# Patient Record
Sex: Female | Born: 1950 | Race: Black or African American | Hispanic: No | Marital: Married | Smoking: Former smoker
Health system: Southern US, Community
[De-identification: ages and names within clinical notes are randomized; demographics above are authoritative.]

## PROBLEM LIST (undated history)

## (undated) DIAGNOSIS — I10 Essential (primary) hypertension: Secondary | ICD-10-CM

## (undated) HISTORY — PX: BREAST EXCISIONAL BIOPSY: SUR124

## (undated) HISTORY — PX: COLONOSCOPY: SHX174

---

## 2005-03-09 ENCOUNTER — Ambulatory Visit: Payer: Self-pay

## 2006-07-12 ENCOUNTER — Ambulatory Visit: Payer: Self-pay

## 2009-10-27 ENCOUNTER — Ambulatory Visit: Payer: Self-pay | Admitting: Family Medicine

## 2011-08-17 ENCOUNTER — Ambulatory Visit: Payer: Self-pay | Admitting: Family Medicine

## 2012-08-28 ENCOUNTER — Ambulatory Visit: Payer: Self-pay | Admitting: Family Medicine

## 2013-11-25 ENCOUNTER — Ambulatory Visit: Payer: Self-pay | Admitting: Family Medicine

## 2014-11-07 ENCOUNTER — Other Ambulatory Visit: Payer: Self-pay | Admitting: Family Medicine

## 2014-11-07 DIAGNOSIS — Z1231 Encounter for screening mammogram for malignant neoplasm of breast: Secondary | ICD-10-CM

## 2014-11-27 ENCOUNTER — Ambulatory Visit
Admission: RE | Admit: 2014-11-27 | Discharge: 2014-11-27 | Disposition: A | Discharge status: Home / Self Care | Payer: No Typology Code available for payment source | Source: Ambulatory Visit | Attending: Family Medicine | Admitting: Family Medicine

## 2014-11-27 DIAGNOSIS — Z1231 Encounter for screening mammogram for malignant neoplasm of breast: Secondary | ICD-10-CM | POA: Insufficient documentation

## 2015-10-19 ENCOUNTER — Other Ambulatory Visit: Payer: Self-pay | Admitting: Family Medicine

## 2015-10-19 DIAGNOSIS — Z1231 Encounter for screening mammogram for malignant neoplasm of breast: Secondary | ICD-10-CM

## 2015-11-30 ENCOUNTER — Other Ambulatory Visit: Payer: Self-pay | Admitting: Family Medicine

## 2015-11-30 ENCOUNTER — Ambulatory Visit
Admission: RE | Admit: 2015-11-30 | Discharge: 2015-11-30 | Disposition: A | Discharge status: Home / Self Care | Payer: Medicare HMO | Source: Ambulatory Visit | Attending: Family Medicine | Admitting: Family Medicine

## 2015-11-30 DIAGNOSIS — Z1231 Encounter for screening mammogram for malignant neoplasm of breast: Secondary | ICD-10-CM

## 2016-05-25 ENCOUNTER — Other Ambulatory Visit: Payer: Self-pay | Admitting: Family Medicine

## 2016-05-25 DIAGNOSIS — Z13820 Encounter for screening for osteoporosis: Secondary | ICD-10-CM

## 2016-07-13 ENCOUNTER — Ambulatory Visit: Payer: No Typology Code available for payment source | Attending: Family Medicine

## 2016-08-29 ENCOUNTER — Ambulatory Visit
Admission: RE | Admit: 2016-08-29 | Discharge: 2016-08-29 | Disposition: A | Discharge status: Home / Self Care | Payer: Medicare HMO | Source: Ambulatory Visit | Attending: Family Medicine | Admitting: Family Medicine

## 2016-08-29 DIAGNOSIS — Z13820 Encounter for screening for osteoporosis: Secondary | ICD-10-CM | POA: Insufficient documentation

## 2016-08-29 DIAGNOSIS — M8580 Other specified disorders of bone density and structure, unspecified site: Secondary | ICD-10-CM | POA: Insufficient documentation

## 2016-10-20 ENCOUNTER — Other Ambulatory Visit: Payer: Self-pay | Admitting: Family Medicine

## 2016-10-20 DIAGNOSIS — Z1231 Encounter for screening mammogram for malignant neoplasm of breast: Secondary | ICD-10-CM

## 2016-11-30 ENCOUNTER — Ambulatory Visit
Admission: RE | Admit: 2016-11-30 | Discharge: 2016-11-30 | Disposition: A | Discharge status: Home / Self Care | Payer: Medicare HMO | Source: Ambulatory Visit | Attending: Family Medicine | Admitting: Family Medicine

## 2016-11-30 DIAGNOSIS — Z1231 Encounter for screening mammogram for malignant neoplasm of breast: Secondary | ICD-10-CM

## 2017-10-19 ENCOUNTER — Other Ambulatory Visit: Payer: Self-pay | Admitting: Family Medicine

## 2017-10-19 DIAGNOSIS — Z1231 Encounter for screening mammogram for malignant neoplasm of breast: Secondary | ICD-10-CM

## 2017-12-20 ENCOUNTER — Ambulatory Visit
Admission: RE | Admit: 2017-12-20 | Discharge: 2017-12-20 | Disposition: A | Discharge status: Home / Self Care | Payer: Medicare HMO | Source: Ambulatory Visit | Attending: Family Medicine | Admitting: Family Medicine

## 2017-12-20 DIAGNOSIS — Z1231 Encounter for screening mammogram for malignant neoplasm of breast: Secondary | ICD-10-CM | POA: Insufficient documentation

## 2017-12-25 ENCOUNTER — Other Ambulatory Visit: Payer: Self-pay | Admitting: Family Medicine

## 2017-12-25 DIAGNOSIS — R928 Other abnormal and inconclusive findings on diagnostic imaging of breast: Secondary | ICD-10-CM

## 2017-12-25 DIAGNOSIS — R921 Mammographic calcification found on diagnostic imaging of breast: Secondary | ICD-10-CM

## 2017-12-28 ENCOUNTER — Ambulatory Visit
Admission: RE | Admit: 2017-12-28 | Discharge: 2017-12-28 | Disposition: A | Discharge status: Home / Self Care | Payer: Medicare HMO | Source: Ambulatory Visit | Attending: Family Medicine | Admitting: Family Medicine

## 2017-12-28 DIAGNOSIS — R921 Mammographic calcification found on diagnostic imaging of breast: Secondary | ICD-10-CM

## 2017-12-28 DIAGNOSIS — R928 Other abnormal and inconclusive findings on diagnostic imaging of breast: Secondary | ICD-10-CM

## 2018-06-15 ENCOUNTER — Encounter: Payer: Self-pay | Admitting: Family Medicine

## 2018-06-18 ENCOUNTER — Other Ambulatory Visit: Payer: Self-pay

## 2018-06-18 DIAGNOSIS — Z1211 Encounter for screening for malignant neoplasm of colon: Secondary | ICD-10-CM

## 2018-06-21 ENCOUNTER — Other Ambulatory Visit: Payer: Self-pay

## 2018-06-21 ENCOUNTER — Encounter: Payer: Self-pay | Admitting: *Deleted

## 2018-06-27 NOTE — Discharge Instructions (Signed)
General Anesthesia, Adult, Care After  This sheet gives you information about how to care for yourself after your procedure. Your health care provider may also give you more specific instructions. If you have problems or questions, contact your health care provider.  What can I expect after the procedure?  After the procedure, the following side effects are common:  Pain or discomfort at the IV site.  Nausea.  Vomiting.  Sore throat.  Trouble concentrating.  Feeling cold or chills.  Weak or tired.  Sleepiness and fatigue.  Soreness and body aches. These side effects can affect parts of the body that were not involved in surgery.  Follow these instructions at home:    For at least 24 hours after the procedure:  Have a responsible adult stay with you. It is important to have someone help care for you until you are awake and alert.  Rest as needed.  Do not:  Participate in activities in which you could fall or become injured.  Drive.  Use heavy machinery.  Drink alcohol.  Take sleeping pills or medicines that cause drowsiness.  Make important decisions or sign legal documents.  Take care of children on your own.  Eating and drinking  Follow any instructions from your health care provider about eating or drinking restrictions.  When you feel hungry, start by eating small amounts of foods that are soft and easy to digest (bland), such as toast. Gradually return to your regular diet.  Drink enough fluid to keep your urine pale yellow.  If you vomit, rehydrate by drinking water, juice, or clear broth.  General instructions  If you have sleep apnea, surgery and certain medicines can increase your risk for breathing problems. Follow instructions from your health care provider about wearing your sleep device:  Anytime you are sleeping, including during daytime naps.  While taking prescription pain medicines, sleeping medicines, or medicines that make you drowsy.  Return to your normal activities as told by your health care  provider. Ask your health care provider what activities are safe for you.  Take over-the-counter and prescription medicines only as told by your health care provider.  If you smoke, do not smoke without supervision.  Keep all follow-up visits as told by your health care provider. This is important.  Contact a health care provider if:  You have nausea or vomiting that does not get better with medicine.  You cannot eat or drink without vomiting.  You have pain that does not get better with medicine.  You are unable to pass urine.  You develop a skin rash.  You have a fever.  You have redness around your IV site that gets worse.  Get help right away if:  You have difficulty breathing.  You have chest pain.  You have blood in your urine or stool, or you vomit blood.  Summary  After the procedure, it is common to have a sore throat or nausea. It is also common to feel tired.  Have a responsible adult stay with you for the first 24 hours after general anesthesia. It is important to have someone help care for you until you are awake and alert.  When you feel hungry, start by eating small amounts of foods that are soft and easy to digest (bland), such as toast. Gradually return to your regular diet.  Drink enough fluid to keep your urine pale yellow.  Return to your normal activities as told by your health care provider. Ask your health care   provider what activities are safe for you.  This information is not intended to replace advice given to you by your health care provider. Make sure you discuss any questions you have with your health care provider.  Document Released: 10/03/2000 Document Revised: 02/10/2017 Document Reviewed: 02/10/2017  Elsevier Interactive Patient Education  2019 [employer]

## 2018-06-29 ENCOUNTER — Ambulatory Visit
Admission: RE | Admit: 2018-06-29 | Discharge: 2018-06-29 | Disposition: A | Discharge status: Home / Self Care | Payer: Medicare HMO | Attending: Gastroenterology | Admitting: Gastroenterology

## 2018-06-29 ENCOUNTER — Encounter
Admission: RE | Disposition: A | Discharge status: Home / Self Care | Payer: Self-pay | Source: Home / Self Care | Attending: Gastroenterology

## 2018-06-29 ENCOUNTER — Ambulatory Visit: Payer: Medicare HMO | Admitting: Anesthesiology

## 2018-06-29 DIAGNOSIS — I10 Essential (primary) hypertension: Secondary | ICD-10-CM | POA: Insufficient documentation

## 2018-06-29 DIAGNOSIS — D123 Benign neoplasm of transverse colon: Secondary | ICD-10-CM | POA: Diagnosis not present

## 2018-06-29 DIAGNOSIS — Z1211 Encounter for screening for malignant neoplasm of colon: Secondary | ICD-10-CM | POA: Diagnosis not present

## 2018-06-29 DIAGNOSIS — Z79899 Other long term (current) drug therapy: Secondary | ICD-10-CM | POA: Insufficient documentation

## 2018-06-29 DIAGNOSIS — D122 Benign neoplasm of ascending colon: Secondary | ICD-10-CM

## 2018-06-29 DIAGNOSIS — K641 Second degree hemorrhoids: Secondary | ICD-10-CM | POA: Diagnosis not present

## 2018-06-29 DIAGNOSIS — Z87891 Personal history of nicotine dependence: Secondary | ICD-10-CM | POA: Insufficient documentation

## 2018-06-29 HISTORY — PX: POLYPECTOMY: SHX5525

## 2018-06-29 HISTORY — PX: COLONOSCOPY WITH PROPOFOL: SHX5780

## 2018-06-29 HISTORY — DX: Essential (primary) hypertension: I10

## 2018-06-29 SURGERY — COLONOSCOPY WITH PROPOFOL
Anesthesia: General | Site: Rectum

## 2018-06-29 MED ORDER — STERILE WATER FOR IRRIGATION IR SOLN
Status: DC | PRN
  Administered 2018-06-29: 10:00:00

## 2018-06-29 MED ORDER — LACTATED RINGERS IV SOLN
INTRAVENOUS | Status: DC
  Administered 2018-06-29: 10:00:00 via INTRAVENOUS

## 2018-06-29 MED ORDER — PROPOFOL 10 MG/ML IV BOLUS
INTRAVENOUS | Status: DC | PRN
  Administered 2018-06-29: 150 mg via INTRAVENOUS
  Administered 2018-06-29 (×5): 20 mg via INTRAVENOUS

## 2018-06-29 MED ORDER — SODIUM CHLORIDE 0.9 % IV SOLN: INTRAVENOUS | Status: DC

## 2018-06-29 SURGICAL SUPPLY — 8 items
CANISTER SUCT 1200ML W/VALVE (MISCELLANEOUS) ×4 IMPLANT
FORCEPS BIOP RAD 4 LRG CAP 4 (CUTTING FORCEPS) ×4 IMPLANT
GOWN CVR UNV OPN BCK APRN NK (MISCELLANEOUS) ×4 IMPLANT
GOWN ISOL THUMB LOOP REG UNIV (MISCELLANEOUS) ×4
KIT ENDO PROCEDURE OLY (KITS) ×4 IMPLANT
SNARE SHORT THROW 13M SML OVAL (MISCELLANEOUS) ×4 IMPLANT
TRAP ETRAP POLY (MISCELLANEOUS) ×4 IMPLANT
WATER STERILE IRR 250ML POUR (IV SOLUTION) ×4 IMPLANT

## 2018-06-29 NOTE — Anesthesia Procedure Notes (Signed)
Date/Time: 06/29/2018 9:48 AM Performed by: Cameron Ali, CRNA Pre-anesthesia Checklist: Patient identified, Emergency Drugs available, Suction available, Timeout performed and Patient being monitored Patient Re-evaluated:Patient Re-evaluated prior to induction Oxygen Delivery Method: Nasal cannula Placement Confirmation: positive ETCO2

## 2018-06-29 NOTE — Op Note (Signed)
[facility] Gastroenterology Patient Name: Barbara Sanchez Procedure Date: 06/29/2018 9:44 AM MRN: [id number] Account #: [id number] Date of Birth: April 28, 1951 Admit Type: Outpatient Age: 67 Room: [facility] OR ROOM 01 Gender: Female Note Status: Finalized Procedure:            Colonoscopy Indications:          Screening for colorectal malignant neoplasm Providers:            Lucilla Lame MD, MD Referring MD:         Marguerita Merles, MD (Referring MD) Medicines:            Propofol per Anesthesia Complications:        No immediate complications. Procedure:            Pre-Anesthesia Assessment:                       - Prior to the procedure, a History and Physical was                        performed, and patient medications and allergies were                        reviewed. The patient's tolerance of previous                        anesthesia was also reviewed. The risks and benefits of                        the procedure and the sedation options and risks were                        discussed with the patient. All questions were                        answered, and informed consent was obtained. Prior                        Anticoagulants: The patient has taken no previous                        anticoagulant or antiplatelet agents. ASA Grade                        Assessment: II - A patient with mild systemic disease.                        After reviewing the risks and benefits, the patient was                        deemed in satisfactory condition to undergo the                        procedure.                       After obtaining informed consent, the colonoscope was                        passed under direct vision. Throughout the procedure,  the patient's blood pressure, pulse, and oxygen                        saturations were monitored continuously. The was                        introduced through the anus and advanced to the the                cecum, identified by appendiceal orifice and ileocecal                        valve. The colonoscopy was performed without                        difficulty. The patient tolerated the procedure well.                        The quality of the bowel preparation was excellent. Findings:      The perianal and digital rectal examinations were normal.      A 8 mm polyp was found in the transverse colon. The polyp was sessile.       The polyp was removed with a cold snare. Resection and retrieval were       complete.      A 2 mm polyp was found in the ascending colon. The polyp was sessile.       The polyp was removed with a cold biopsy forceps. Resection and       retrieval were complete.      Non-bleeding internal hemorrhoids were found during retroflexion. The       hemorrhoids were Grade II (internal hemorrhoids that prolapse but reduce       spontaneously). Impression:           - One 8 mm polyp in the transverse colon, removed with                        a cold snare. Resected and retrieved.                       - One 2 mm polyp in the ascending colon, removed with a                        cold biopsy forceps. Resected and retrieved.                       - Non-bleeding internal hemorrhoids. Recommendation:       - Discharge patient to home.                       - Resume previous diet.                       - Continue present medications.                       - Await pathology results.                       - Repeat colonoscopy in 5 years if polyp adenoma and 10  years if hyperplastic Procedure Code(s):    --- Professional ---                       [phone], Colonoscopy, flexible; with removal of tumor(s),                        polyp(s), or other lesion(s) by snare technique                       [id number], 59, Colonoscopy, flexible; with biopsy, single                        or multiple Diagnosis Code(s):    --- Professional ---                        Z12.11, Encounter for screening for malignant neoplasm                        of colon                       D12.3, Benign neoplasm of transverse colon (hepatic                        flexure or splenic flexure)                       D12.2, Benign neoplasm of ascending colon CPT copyright 2018 American Medical Association. All rights reserved. The codes documented in this report are preliminary and upon coder review may  be revised to meet current compliance requirements. Lucilla Lame MD, MD 06/29/2018 10:09:56 AM This report has been signed electronically. Number of Addenda: 0 Note Initiated On: 06/29/2018 9:44 AM Scope Withdrawal Time: 0 hours 7 minutes 24 seconds  Total Procedure Duration: 0 hours 15 minutes 37 seconds       [facility]

## 2018-06-29 NOTE — Transfer of Care (Signed)
Immediate Anesthesia Transfer of Care Note  Patient: Barbara Sanchez  Procedure(s) Performed: COLONOSCOPY WITH PROPOFOL (N/A Rectum) POLYPECTOMY (Rectum)  Patient Location: PACU  Anesthesia Type: General  Level of Consciousness: awake, alert  and patient cooperative  Airway and Oxygen Therapy: Patient Spontanous Breathing and Patient connected to supplemental oxygen  Post-op Assessment: Post-op Vital signs reviewed, Patient's Cardiovascular Status Stable, Respiratory Function Stable, Patent Airway and No signs of Nausea or vomiting  Post-op Vital Signs: Reviewed and stable  Complications: No apparent anesthesia complications

## 2018-06-29 NOTE — Anesthesia Preprocedure Evaluation (Signed)
Anesthesia Evaluation  Patient identified by MRN, date of birth, ID band Patient awake    Reviewed: Allergy & Precautions, NPO status , Patient's Chart, lab work & pertinent test results  Airway Mallampati: II  TM Distance: >3 FB Neck ROM: Full    Dental no notable dental hx.    Pulmonary neg pulmonary ROS, former smoker,    Pulmonary exam normal breath sounds clear to auscultation       Cardiovascular hypertension, Normal cardiovascular exam Rhythm:Regular Rate:Normal     Neuro/Psych negative neurological ROS  negative psych ROS   GI/Hepatic negative GI ROS, Neg liver ROS,   Endo/Other  negative endocrine ROS  Renal/GU negative Renal ROS  negative genitourinary   Musculoskeletal negative musculoskeletal ROS (+)   Abdominal   Peds negative pediatric ROS (+)  Hematology negative hematology ROS (+)   Anesthesia Other Findings   Reproductive/Obstetrics negative OB ROS                             Anesthesia Physical Anesthesia Plan  ASA: II  Anesthesia Plan: General   Post-op Pain Management:    Induction: Intravenous  PONV Risk Score and Plan:   Airway Management Planned: Natural Airway  Additional Equipment:   Intra-op Plan:   Post-operative Plan: Extubation in OR  Informed Consent: I have reviewed the patients History and Physical, chart, labs and discussed the procedure including the risks, benefits and alternatives for the proposed anesthesia with the patient or authorized representative who has indicated his/her understanding and acceptance.   Dental advisory given  Plan Discussed with: CRNA  Anesthesia Plan Comments:         Anesthesia Quick Evaluation

## 2018-06-29 NOTE — H&P (Signed)
Barbara Lame, MD Barbara Sanchez [address], [place], [place] [address] Phone: [phone] Fax : [phone]  Primary Care Physician:  Marguerita Merles, MD Primary Gastroenterologist:  Dr. Allen Norris  Pre-Procedure History & Physical: HPI:  Barbara Sanchez is a 67 y.o. female is here for a screening colonoscopy.   Past Medical History:  Diagnosis Date  . Hypertension     Past Surgical History:  Procedure Laterality Date  . BREAST EXCISIONAL BIOPSY Right around 15 years ago   negative  . COLONOSCOPY      Prior to Admission medications   Medication Sig Start Date End Date Taking? Authorizing Provider  amLODipine (NORVASC) 5 MG tablet Take 5 mg by mouth daily.   Yes [provider]  Calcium Carb-Cholecalciferol (CALCIUM 600-D PO) Take by mouth daily.   Yes [provider]  hydrochlorothiazide (HYDRODIURIL) 25 MG tablet Take 25 mg by mouth daily.   Yes [provider]  potassium chloride (K-DUR,KLOR-CON) 10 MEQ tablet Take 10 mEq by mouth daily.   Yes [provider]  pravastatin (PRAVACHOL) 20 MG tablet Take 20 mg by mouth daily.   Yes [provider]    Allergies as of 06/18/2018  . (Not on File)    Family History  Problem Relation Age of Onset  . Breast cancer Maternal Grandmother 70    Social History   Socioeconomic History  . Marital status: Married    Spouse name: Not on file  . Number of children: Not on file  . Years of education: Not on file  . Highest education level: Not on file  Occupational History  . Not on file  Social Needs  . Financial resource strain: Not on file  . Food insecurity:    Worry: Not on file    Inability: Not on file  . Transportation needs:    Medical: Not on file    Non-medical: Not on file  Tobacco Use  . Smoking status: Former Smoker    Packs/day: 0.75    Years: 20.00    Pack years: 15.00    Types: Cigarettes    Last attempt to quit: 01/08/2018    Years since quitting: 0.4  .  Smokeless tobacco: Never Used  Substance and Sexual Activity  . Alcohol use: Not Currently  . Drug use: Not on file  . Sexual activity: Not on file  Lifestyle  . Physical activity:    Days per week: Not on file    Minutes per session: Not on file  . Stress: Not on file  Relationships  . Social connections:    Talks on phone: Not on file    Gets together: Not on file    Attends religious service: Not on file    Active member of club or organization: Not on file    Attends meetings of clubs or organizations: Not on file    Relationship status: Not on file  . Intimate partner violence:    Fear of current or ex partner: Not on file    Emotionally abused: Not on file    Physically abused: Not on file    Forced sexual activity: Not on file  Other Topics Concern  . Not on file  Social History Narrative  . Not on file    Review of Systems: See HPI, otherwise negative ROS  Physical Exam: BP (!) 142/83   Pulse 92   Temp (!) 97.5 F (36.4 C) (Temporal)   Ht 5\' 6"  (1.676 m)  Wt 78.9 kg   SpO2 100%   BMI 28.08 kg/m  General:   Alert,  pleasant and cooperative in NAD Head:  Normocephalic and atraumatic. Neck:  Supple; no masses or thyromegaly. Lungs:  Clear throughout to auscultation.    Heart:  Regular rate and rhythm. Abdomen:  Soft, nontender and nondistended. Normal bowel sounds, without guarding, and without rebound.   Neurologic:  Alert and  oriented x4;  grossly normal neurologically.  Impression/Plan: Barbara Sanchez is now here to undergo a screening colonoscopy.  Risks, benefits, and alternatives regarding colonoscopy have been reviewed with the patient.  Questions have been answered.  All parties agreeable.

## 2018-06-29 NOTE — Anesthesia Postprocedure Evaluation (Signed)
Anesthesia Post Note  Patient: Barbara Sanchez  Procedure(s) Performed: COLONOSCOPY WITH PROPOFOL (N/A Rectum) POLYPECTOMY (Rectum)  Patient location during evaluation: PACU Anesthesia Type: General Level of consciousness: awake and alert Pain management: pain level controlled Vital Signs Assessment: post-procedure vital signs reviewed and stable Respiratory status: spontaneous breathing, nonlabored ventilation, respiratory function stable and patient connected to nasal cannula oxygen Cardiovascular status: blood pressure returned to baseline and stable Postop Assessment: no apparent nausea or vomiting Anesthetic complications: no    Lucas Exline C

## 2018-07-02 ENCOUNTER — Encounter: Payer: Self-pay | Admitting: Gastroenterology

## 2018-07-06 ENCOUNTER — Encounter: Payer: Self-pay | Admitting: Gastroenterology

## 2018-11-21 ENCOUNTER — Other Ambulatory Visit: Payer: Self-pay | Admitting: Family Medicine

## 2018-11-21 DIAGNOSIS — Z1231 Encounter for screening mammogram for malignant neoplasm of breast: Secondary | ICD-10-CM

## 2018-12-25 ENCOUNTER — Ambulatory Visit
Admission: RE | Admit: 2018-12-25 | Discharge: 2018-12-25 | Disposition: A | Discharge status: Home / Self Care | Payer: Medicare HMO | Source: Ambulatory Visit | Attending: Family Medicine | Admitting: Family Medicine

## 2018-12-25 ENCOUNTER — Other Ambulatory Visit: Payer: Self-pay

## 2018-12-25 DIAGNOSIS — Z1231 Encounter for screening mammogram for malignant neoplasm of breast: Secondary | ICD-10-CM

## 2019-11-22 ENCOUNTER — Other Ambulatory Visit: Payer: Self-pay | Admitting: Family Medicine

## 2019-11-22 DIAGNOSIS — Z1231 Encounter for screening mammogram for malignant neoplasm of breast: Secondary | ICD-10-CM

## 2019-12-26 ENCOUNTER — Ambulatory Visit
Admission: RE | Admit: 2019-12-26 | Discharge: 2019-12-26 | Disposition: A | Discharge status: Home / Self Care | Payer: Medicare HMO | Source: Ambulatory Visit | Attending: Family Medicine | Admitting: Family Medicine

## 2019-12-26 DIAGNOSIS — Z1231 Encounter for screening mammogram for malignant neoplasm of breast: Secondary | ICD-10-CM | POA: Diagnosis not present

## 2020-04-28 ENCOUNTER — Other Ambulatory Visit: Payer: Self-pay | Admitting: Family Medicine

## 2020-04-28 DIAGNOSIS — Z1231 Encounter for screening mammogram for malignant neoplasm of breast: Secondary | ICD-10-CM

## 2020-04-29 ENCOUNTER — Other Ambulatory Visit: Payer: Self-pay | Admitting: Family Medicine

## 2020-04-29 DIAGNOSIS — Z0000 Encounter for general adult medical examination without abnormal findings: Secondary | ICD-10-CM

## 2020-12-29 ENCOUNTER — Other Ambulatory Visit: Payer: Self-pay

## 2020-12-29 ENCOUNTER — Ambulatory Visit
Admission: RE | Admit: 2020-12-29 | Discharge: 2020-12-29 | Disposition: A | Discharge status: Home / Self Care | Payer: Medicare HMO | Source: Ambulatory Visit | Attending: Family Medicine | Admitting: Family Medicine

## 2020-12-29 DIAGNOSIS — Z13820 Encounter for screening for osteoporosis: Secondary | ICD-10-CM | POA: Diagnosis not present

## 2020-12-29 DIAGNOSIS — Z780 Asymptomatic menopausal state: Secondary | ICD-10-CM | POA: Insufficient documentation

## 2020-12-29 DIAGNOSIS — M810 Age-related osteoporosis without current pathological fracture: Secondary | ICD-10-CM | POA: Insufficient documentation

## 2020-12-29 DIAGNOSIS — Z1231 Encounter for screening mammogram for malignant neoplasm of breast: Secondary | ICD-10-CM | POA: Diagnosis present

## 2020-12-29 DIAGNOSIS — Z0000 Encounter for general adult medical examination without abnormal findings: Secondary | ICD-10-CM

## 2021-11-23 ENCOUNTER — Other Ambulatory Visit: Payer: Self-pay | Admitting: Family Medicine

## 2021-11-23 DIAGNOSIS — Z1231 Encounter for screening mammogram for malignant neoplasm of breast: Secondary | ICD-10-CM

## 2021-12-30 ENCOUNTER — Ambulatory Visit
Admission: RE | Admit: 2021-12-30 | Discharge: 2021-12-30 | Disposition: A | Discharge status: Home / Self Care | Payer: Medicare HMO | Source: Ambulatory Visit | Attending: Family Medicine | Admitting: Family Medicine

## 2021-12-30 DIAGNOSIS — Z1231 Encounter for screening mammogram for malignant neoplasm of breast: Secondary | ICD-10-CM | POA: Diagnosis present

## 2022-08-31 ENCOUNTER — Other Ambulatory Visit: Payer: Self-pay | Admitting: Family Medicine

## 2022-08-31 DIAGNOSIS — F17200 Nicotine dependence, unspecified, uncomplicated: Secondary | ICD-10-CM

## 2022-08-31 DIAGNOSIS — I10 Essential (primary) hypertension: Secondary | ICD-10-CM

## 2022-08-31 DIAGNOSIS — E782 Mixed hyperlipidemia: Secondary | ICD-10-CM

## 2022-09-07 ENCOUNTER — Ambulatory Visit: Payer: Medicare HMO

## 2022-09-08 ENCOUNTER — Ambulatory Visit
Admission: RE | Admit: 2022-09-08 | Discharge: 2022-09-08 | Disposition: A | Discharge status: Home / Self Care | Payer: Medicare HMO | Source: Ambulatory Visit | Attending: Family Medicine | Admitting: Family Medicine

## 2022-09-08 DIAGNOSIS — I10 Essential (primary) hypertension: Secondary | ICD-10-CM | POA: Insufficient documentation

## 2022-09-08 DIAGNOSIS — E782 Mixed hyperlipidemia: Secondary | ICD-10-CM

## 2022-09-08 DIAGNOSIS — F17200 Nicotine dependence, unspecified, uncomplicated: Secondary | ICD-10-CM

## 2022-11-16 ENCOUNTER — Other Ambulatory Visit: Payer: Self-pay | Admitting: Family Medicine

## 2022-11-16 DIAGNOSIS — Z1231 Encounter for screening mammogram for malignant neoplasm of breast: Secondary | ICD-10-CM

## 2023-01-02 ENCOUNTER — Ambulatory Visit
Admission: RE | Admit: 2023-01-02 | Discharge: 2023-01-02 | Disposition: A | Discharge status: Home / Self Care | Payer: Medicare HMO | Source: Ambulatory Visit | Attending: Family Medicine | Admitting: Family Medicine

## 2023-01-02 DIAGNOSIS — Z1231 Encounter for screening mammogram for malignant neoplasm of breast: Secondary | ICD-10-CM | POA: Insufficient documentation

## 2023-11-20 ENCOUNTER — Other Ambulatory Visit: Payer: Self-pay | Admitting: Family Medicine

## 2023-11-20 DIAGNOSIS — Z1231 Encounter for screening mammogram for malignant neoplasm of breast: Secondary | ICD-10-CM

## 2024-01-04 ENCOUNTER — Ambulatory Visit
Admission: RE | Admit: 2024-01-04 | Discharge: 2024-01-04 | Disposition: A | Discharge status: Home / Self Care | Source: Ambulatory Visit | Attending: Family Medicine | Admitting: Family Medicine

## 2024-01-04 DIAGNOSIS — Z1231 Encounter for screening mammogram for malignant neoplasm of breast: Secondary | ICD-10-CM | POA: Insufficient documentation

## 2024-02-13 ENCOUNTER — Other Ambulatory Visit: Payer: Self-pay | Admitting: Family Medicine

## 2024-02-13 DIAGNOSIS — M6289 Other specified disorders of muscle: Secondary | ICD-10-CM

## 2024-02-15 ENCOUNTER — Other Ambulatory Visit: Payer: Self-pay

## 2024-02-15 ENCOUNTER — Telehealth: Payer: Self-pay

## 2024-02-15 DIAGNOSIS — Z860100 Personal history of colon polyps, unspecified: Secondary | ICD-10-CM

## 2024-02-15 MED ORDER — NA SULFATE-K SULFATE-MG SULF 17.5-3.13-1.6 GM/177ML PO SOLN: 1.0000 | Freq: Once | ORAL | 0 refills | Status: AC

## 2024-02-15 NOTE — Telephone Encounter (Signed)
 Gastroenterology Pre-Procedure Review  Request Date: 05/27/24 Requesting Physician: Dr. Jinny  PATIENT REVIEW QUESTIONS: The patient responded to the following health history questions as indicated:    1. Are you having any GI issues? no 2. Do you have a personal history of Polyps? yes (last colonoscopy performed by Dr. Jinny 06/29/2018 recommended repeat in 5 years) 3. Do you have a family history of Colon Cancer or Polyps? no 4. Diabetes Mellitus? no 5. Joint replacements in the past 12 months?no 6. Major health problems in the past 3 months?no 7. Any artificial heart valves, MVP, or defibrillator?no    MEDICATIONS & ALLERGIES:    Patient reports the following regarding taking any anticoagulation/antiplatelet therapy:   Plavix, Coumadin, Eliquis, Xarelto, Lovenox, Pradaxa, Brilinta, or Effient? no Aspirin? no  Patient confirms/reports the following medications:  Current Outpatient Medications  Medication Sig Dispense Refill   alendronate (FOSAMAX) 70 MG tablet Take 70 mg by mouth once a week.     amLODipine (NORVASC) 5 MG tablet Take 5 mg by mouth daily.     Calcium Carb-Cholecalciferol (CALCIUM 600-D PO) Take by mouth daily.     hydrochlorothiazide (HYDRODIURIL) 25 MG tablet Take 25 mg by mouth daily.     Na Sulfate-K Sulfate-Mg Sulfate concentrate (SUPREP) 17.5-3.13-1.6 GM/177ML SOLN Take 1 kit (354 mLs total) by mouth once for 1 dose. 354 mL 0   pravastatin (PRAVACHOL) 20 MG tablet Take 20 mg by mouth daily.     potassium chloride (K-DUR,KLOR-CON) 10 MEQ tablet Take 10 mEq by mouth daily. (Patient not taking: Reported on 02/15/2024)     No current facility-administered medications for this visit.    Patient confirms/reports the following allergies:  No Known Allergies  No orders of the defined types were placed in this encounter.   AUTHORIZATION INFORMATION Primary Insurance: 1D#: Group #:  Secondary Insurance: 1D#: Group #:  SCHEDULE INFORMATION: Date:  05/27/24 Time: Location: [facility]

## 2024-02-19 ENCOUNTER — Ambulatory Visit
Admission: RE | Admit: 2024-02-19 | Discharge: 2024-02-19 | Disposition: A | Discharge status: Home / Self Care | Source: Ambulatory Visit | Attending: Family Medicine | Admitting: Family Medicine

## 2024-02-19 DIAGNOSIS — M6289 Other specified disorders of muscle: Secondary | ICD-10-CM | POA: Diagnosis present

## 2024-02-19 IMAGING — MG MM DIGITAL SCREENING BILAT W/ TOMO AND CAD
6 of 10 series · 6 of 30 positions shown · non-contrast
Comparison: Previous exam(s).

CLINICAL DATA: Screening.

EXAM:
DIGITAL SCREENING BILATERAL MAMMOGRAM WITH TOMOSYNTHESIS AND CAD
TECHNIQUE: Bilateral screening digital craniocaudal and mediolateral oblique
mammograms were obtained. Bilateral screening digital breast
tomosynthesis was performed. The images were evaluated with
computer-aided detection.

[R CC synth-2D]
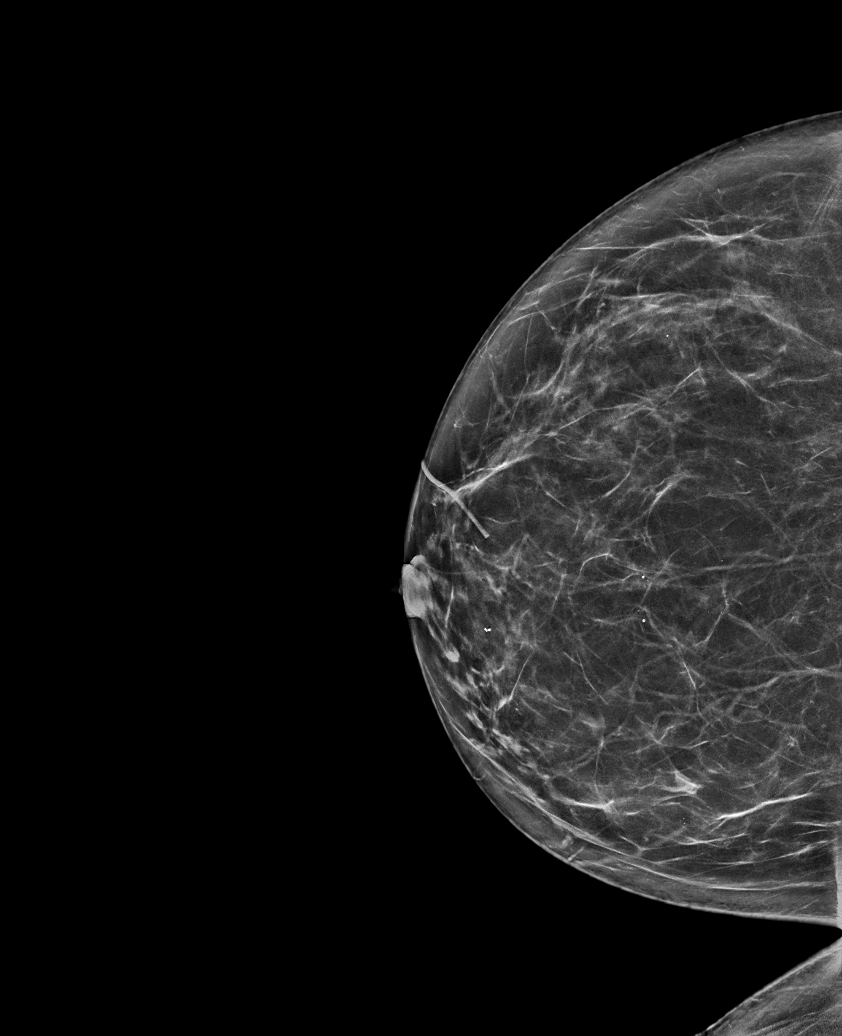

[L MLO synth-2D]
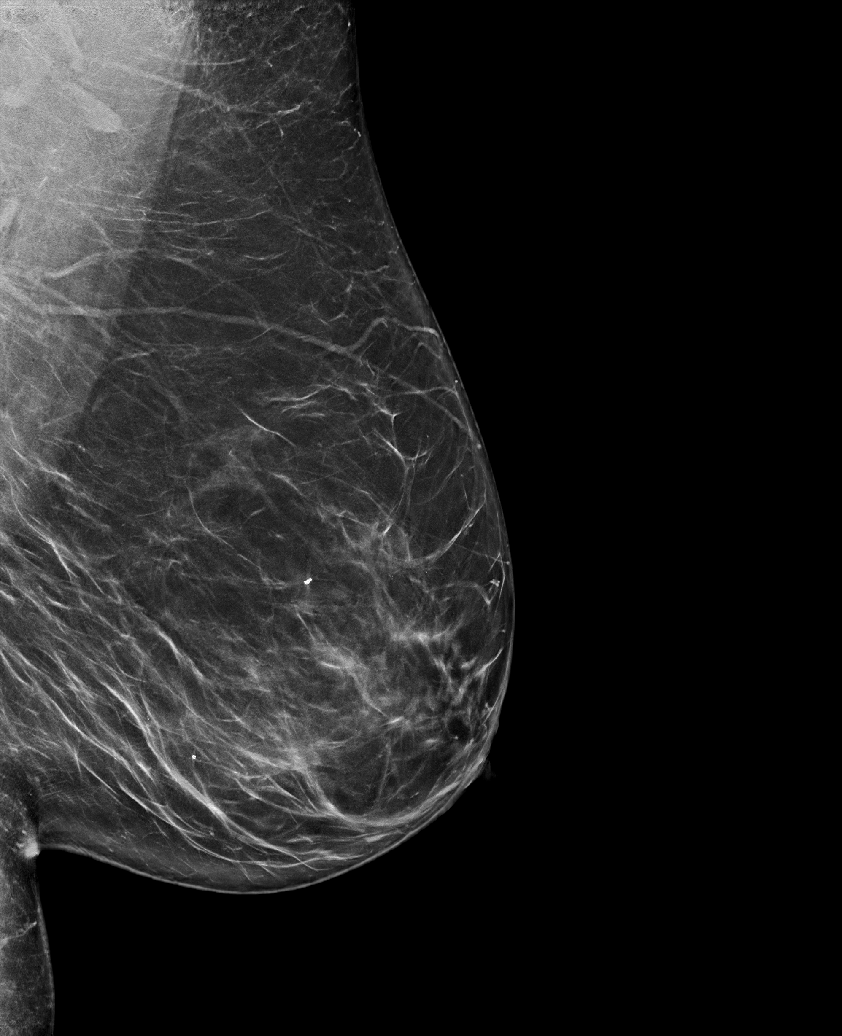

[L CC synth-2D (1 of 2)]
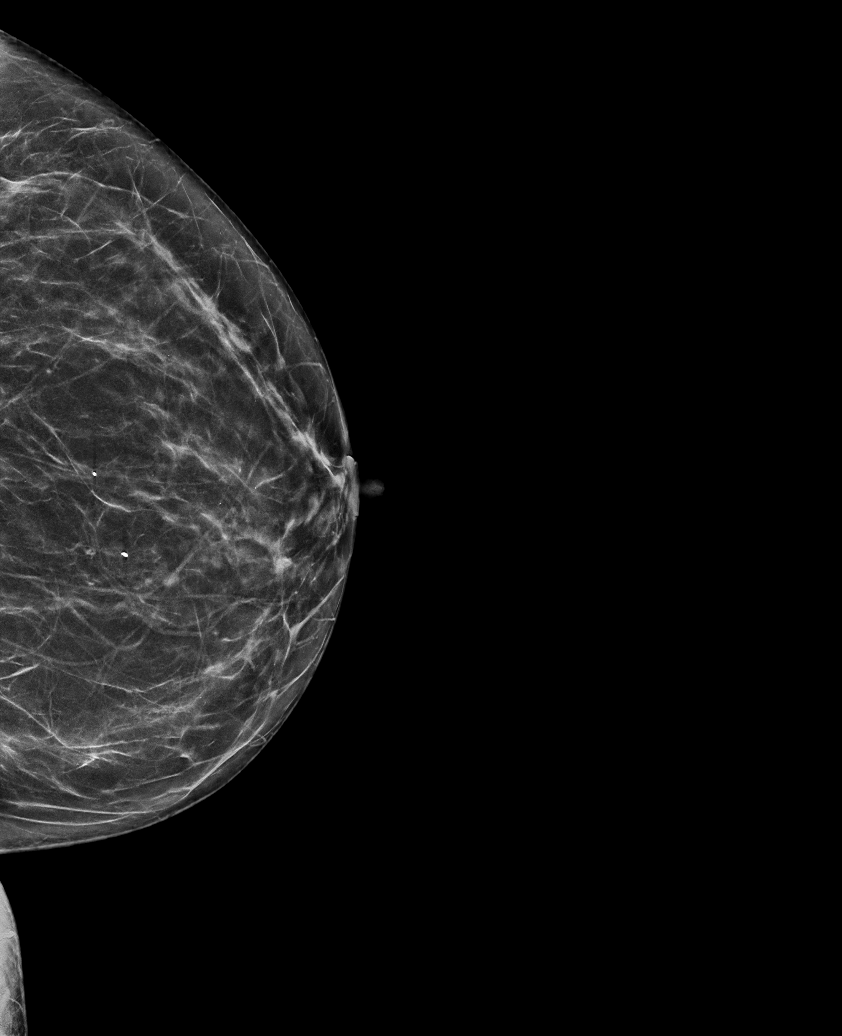

[L CC synth-2D (2 of 2)]
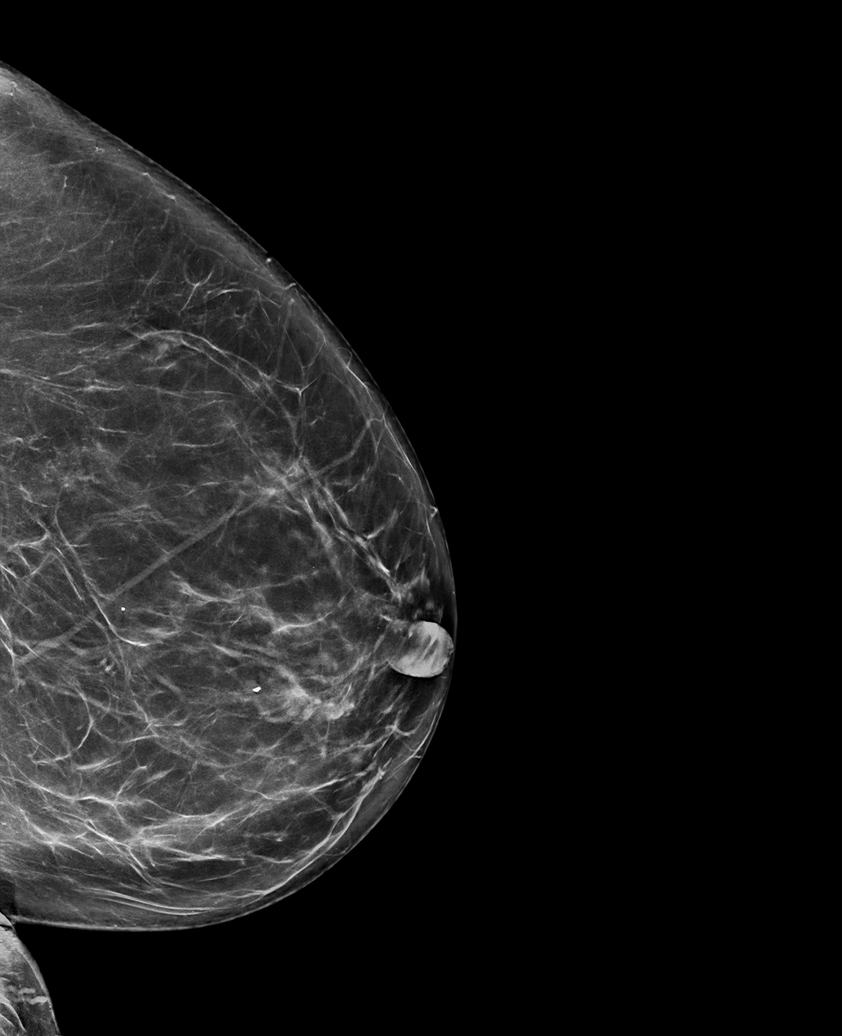

[R MLO synth-2D]
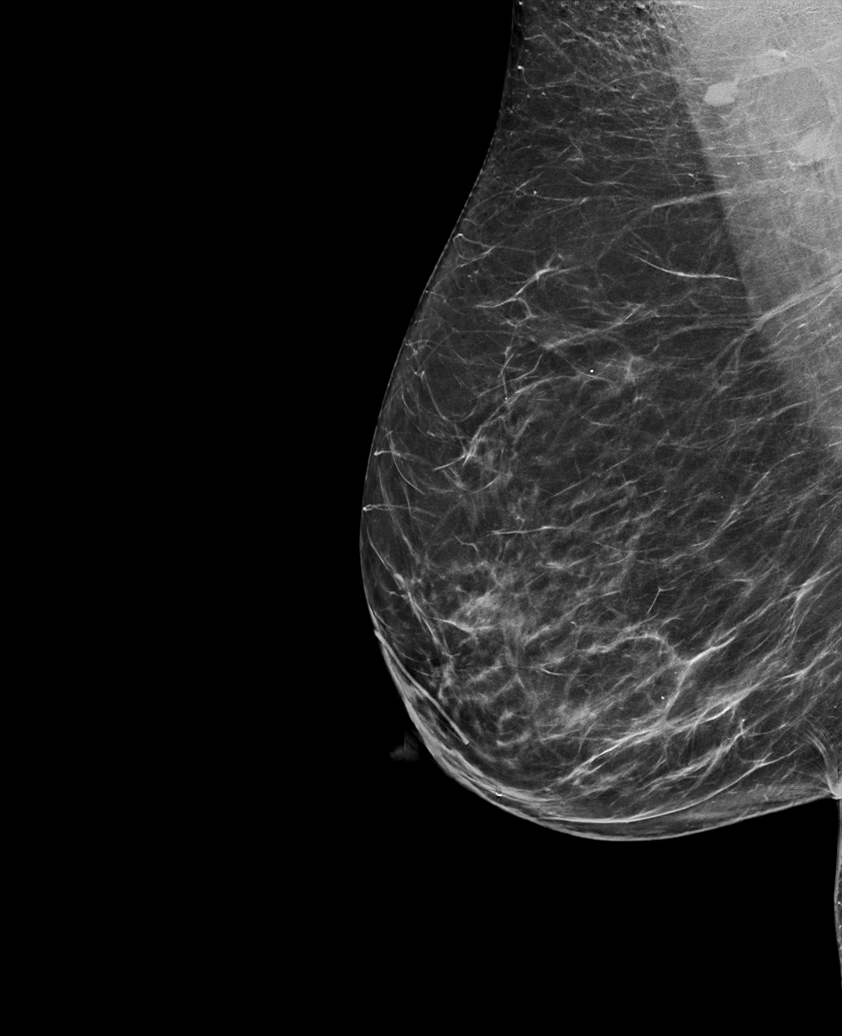

[L CC tomo · tomo slice 41/81.0]
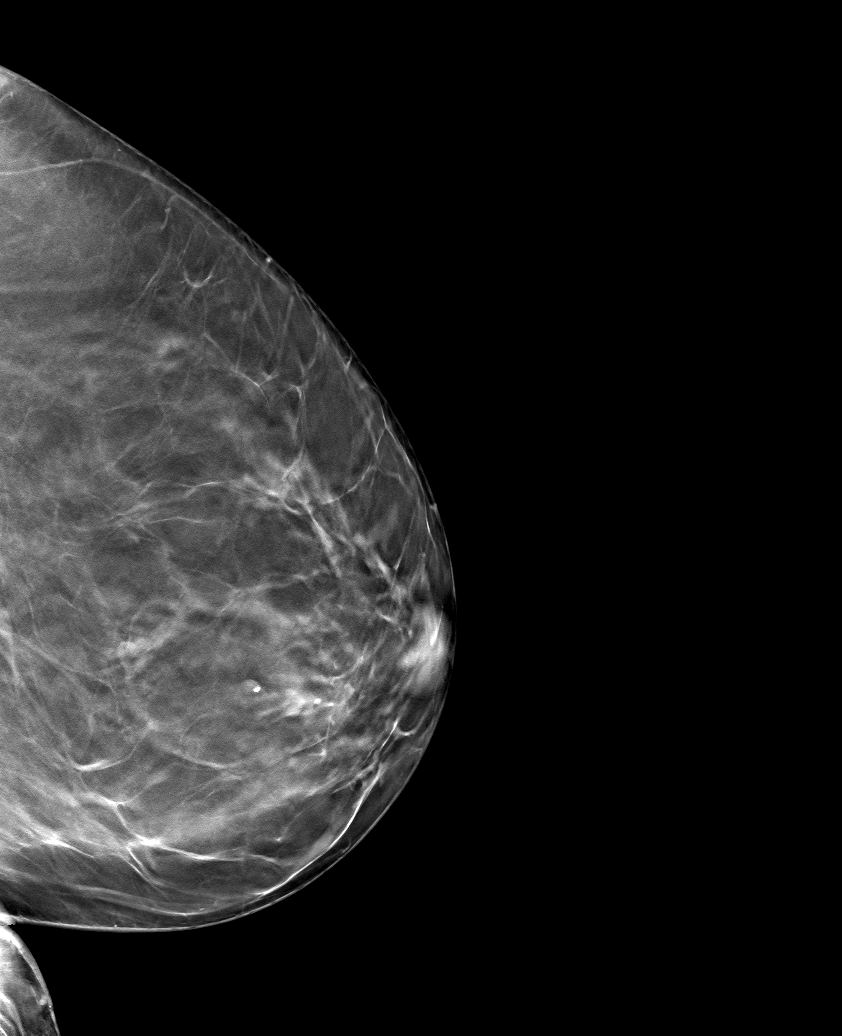

[6 of 30 positions shown; findings below may reference images not displayed]

ACR Breast Density Category b: There are scattered areas of
fibroglandular density.
FINDINGS: There are no findings suspicious for malignancy.
IMPRESSION: No mammographic evidence of malignancy. A result letter of this
screening mammogram will be mailed directly to the patient.

RECOMMENDATION:
Screening mammogram in one year. (Code:51-O-LD2)

BI-RADS CATEGORY  1: Negative.

## 2024-03-13 ENCOUNTER — Encounter: Payer: Self-pay | Admitting: Family Medicine

## 2024-04-19 ENCOUNTER — Other Ambulatory Visit: Payer: Self-pay | Admitting: Family Medicine

## 2024-04-19 DIAGNOSIS — M6289 Other specified disorders of muscle: Secondary | ICD-10-CM

## 2024-04-30 ENCOUNTER — Ambulatory Visit

## 2024-05-27 ENCOUNTER — Ambulatory Visit: Admitting: Anesthesiology

## 2024-05-27 ENCOUNTER — Encounter
Admission: RE | Disposition: A | Discharge status: Home / Self Care | Payer: Self-pay | Source: Home / Self Care | Attending: Gastroenterology

## 2024-05-27 ENCOUNTER — Ambulatory Visit
Admission: RE | Admit: 2024-05-27 | Discharge: 2024-05-27 | Disposition: A | Discharge status: Home / Self Care | Attending: Gastroenterology | Admitting: Gastroenterology

## 2024-05-27 ENCOUNTER — Encounter: Payer: Self-pay | Admitting: Gastroenterology

## 2024-05-27 DIAGNOSIS — Z860100 Personal history of colon polyps, unspecified: Secondary | ICD-10-CM | POA: Diagnosis not present

## 2024-05-27 DIAGNOSIS — D123 Benign neoplasm of transverse colon: Secondary | ICD-10-CM | POA: Diagnosis not present

## 2024-05-27 DIAGNOSIS — I10 Essential (primary) hypertension: Secondary | ICD-10-CM | POA: Diagnosis not present

## 2024-05-27 DIAGNOSIS — Z860101 Personal history of adenomatous and serrated colon polyps: Secondary | ICD-10-CM | POA: Diagnosis present

## 2024-05-27 DIAGNOSIS — K641 Second degree hemorrhoids: Secondary | ICD-10-CM | POA: Insufficient documentation

## 2024-05-27 DIAGNOSIS — Z1211 Encounter for screening for malignant neoplasm of colon: Secondary | ICD-10-CM | POA: Insufficient documentation

## 2024-05-27 DIAGNOSIS — K635 Polyp of colon: Secondary | ICD-10-CM

## 2024-05-27 DIAGNOSIS — Z87891 Personal history of nicotine dependence: Secondary | ICD-10-CM | POA: Diagnosis not present

## 2024-05-27 DIAGNOSIS — Z79899 Other long term (current) drug therapy: Secondary | ICD-10-CM | POA: Insufficient documentation

## 2024-05-27 HISTORY — PX: POLYPECTOMY: SHX149

## 2024-05-27 HISTORY — PX: COLONOSCOPY: SHX5424

## 2024-05-27 SURGERY — COLONOSCOPY
Anesthesia: General

## 2024-05-27 MED ORDER — PROPOFOL 500 MG/50ML IV EMUL
INTRAVENOUS | Status: DC | PRN
  Administered 2024-05-27: 75 ug/kg/min via INTRAVENOUS

## 2024-05-27 MED ORDER — LIDOCAINE HCL (PF) 2 % IJ SOLN
INTRAMUSCULAR | Status: DC | PRN
  Administered 2024-05-27: 80 mg via INTRADERMAL

## 2024-05-27 MED ORDER — SODIUM CHLORIDE 0.9 % IV SOLN
INTRAVENOUS | Status: DC
  Administered 2024-05-27: 20 mL/h via INTRAVENOUS

## 2024-05-27 MED ORDER — PROPOFOL 10 MG/ML IV BOLUS
INTRAVENOUS | Status: DC | PRN
  Administered 2024-05-27: 80 mg via INTRAVENOUS

## 2024-05-27 NOTE — Anesthesia Postprocedure Evaluation (Signed)
 Anesthesia Post Note  Patient: Barbara Sanchez  Procedure(s) Performed: COLONOSCOPY POLYPECTOMY, INTESTINE  Patient location during evaluation: Endoscopy Anesthesia Type: General Level of consciousness: awake and alert Pain management: pain level controlled Vital Signs Assessment: post-procedure vital signs reviewed and stable Respiratory status: spontaneous breathing, nonlabored ventilation, respiratory function stable and patient connected to nasal cannula oxygen Cardiovascular status: blood pressure returned to baseline and stable Postop Assessment: no apparent nausea or vomiting Anesthetic complications: no   No notable events documented.   Last Vitals:  Vitals:   05/27/24 0933 05/27/24 0943  BP: 117/72   Pulse:    Resp: (!) 21   Temp:    SpO2: 98% 98%    Last Pain:  Vitals:   05/27/24 0933  TempSrc:   PainSc: 0-No pain                 Lendia LITTIE Mae

## 2024-05-27 NOTE — Anesthesia Preprocedure Evaluation (Addendum)
 Anesthesia Evaluation  Patient identified by MRN, date of birth, ID band Patient awake    Reviewed: Allergy & Precautions, NPO status , Patient's Chart, lab work & pertinent test results  History of Anesthesia Complications Negative for: history of anesthetic complications  Airway Mallampati: III  TM Distance: >3 FB Neck ROM: full    Dental  (+) Missing   Pulmonary former smoker   Pulmonary exam normal        Cardiovascular hypertension, On Medications Normal cardiovascular exam     Neuro/Psych negative neurological ROS  negative psych ROS   GI/Hepatic negative GI ROS, Neg liver ROS,,,  Endo/Other  negative endocrine ROS    Renal/GU negative Renal ROS  negative genitourinary   Musculoskeletal   Abdominal   Peds  Hematology negative hematology ROS (+)   Anesthesia Other Findings Past Medical History: No date: Hypertension  Past Surgical History: around 15 years ago: BREAST EXCISIONAL BIOPSY; Right     Comment:  negative No date: COLONOSCOPY 06/29/2018: COLONOSCOPY WITH PROPOFOL ; N/A     Comment:  Procedure: COLONOSCOPY WITH PROPOFOL ;  Surgeon: Jinny Carmine, MD;  Location: [facility];  Service:               Endoscopy;  Laterality: N/A; 06/29/2018: POLYPECTOMY     Comment:  Procedure: POLYPECTOMY;  Surgeon: Jinny Carmine, MD;                Location: [facility];  Service: Endoscopy;;  BMI    Body Mass Index: 25.99 kg/m      Reproductive/Obstetrics negative OB ROS                              Anesthesia Physical Anesthesia Plan  ASA: 2  Anesthesia Plan: General   Post-op Pain Management: Minimal or no pain anticipated   Induction: Intravenous  PONV Risk Score and Plan: 2 and Propofol  infusion and TIVA  Airway Management Planned: Natural Airway and Nasal Cannula  Additional Equipment:   Intra-op Plan:   Post-operative Plan:    Informed Consent: I have reviewed the patients History and Physical, chart, labs and discussed the procedure including the risks, benefits and alternatives for the proposed anesthesia with the patient or authorized representative who has indicated his/her understanding and acceptance.     Dental Advisory Given  Plan Discussed with: Anesthesiologist, CRNA and Surgeon  Anesthesia Plan Comments: (Patient consented for risks of anesthesia including but not limited to:  - adverse reactions to medications - risk of airway placement if required - damage to eyes, teeth, lips or other oral mucosa - nerve damage due to positioning  - sore throat or hoarseness - Damage to heart, brain, nerves, lungs, other parts of body or loss of life  Patient voiced understanding and assent.)         Anesthesia Quick Evaluation

## 2024-05-27 NOTE — Transfer of Care (Signed)
 Immediate Anesthesia Transfer of Care Note  Patient: Barbara Sanchez  Procedure(s) Performed: COLONOSCOPY POLYPECTOMY, INTESTINE  Patient Location: PACU  Anesthesia Type:General  Level of Consciousness: awake  Airway & Oxygen Therapy: Patient Spontanous Breathing  Post-op Assessment: Report given to RN and Post -op Vital signs reviewed and stable  Post vital signs: Reviewed and stable  Last Vitals:  Vitals Value Taken Time  BP    Temp    Pulse    Resp    SpO2      Last Pain:  Vitals:   05/27/24 0741  TempSrc: Temporal  PainSc: 0-No pain         Complications: No notable events documented.

## 2024-05-27 NOTE — H&P (Signed)
 Barbara Copping, MD [facility] [address], [address] [place], [place] [address] Phone:[phone] Fax : [phone]  Primary Care Physician:  Buren Rock HERO, MD Primary Gastroenterologist:  Dr. Copping  Pre-Procedure History & Physical: HPI:  Barbara Sanchez is a 73 y.o. female is here for an colonoscopy.   Past Medical History:  Diagnosis Date   Hypertension     Past Surgical History:  Procedure Laterality Date   BREAST EXCISIONAL BIOPSY Right around 15 years ago   negative   COLONOSCOPY     COLONOSCOPY WITH PROPOFOL  N/A 06/29/2018   Procedure: COLONOSCOPY WITH PROPOFOL ;  Surgeon: Sanchez Rogelia, MD;  Location: [facility];  Service: Endoscopy;  Laterality: N/A;   POLYPECTOMY  06/29/2018   Procedure: POLYPECTOMY;  Surgeon: Sanchez Rogelia, MD;  Location: [facility];  Service: Endoscopy;;    Prior to Admission medications   Medication Sig Start Date End Date Taking? Authorizing Provider  alendronate (FOSAMAX) 70 MG tablet Take 70 mg by mouth once a week. 12/06/23  Yes [provider]  amLODipine (NORVASC) 5 MG tablet Take 5 mg by mouth daily.   Yes [provider]  Calcium Carb-Cholecalciferol (CALCIUM 600-D PO) Take by mouth daily.   Yes [provider]  hydrochlorothiazide (HYDRODIURIL) 25 MG tablet Take 25 mg by mouth daily.   Yes [provider]  pravastatin (PRAVACHOL) 20 MG tablet Take 20 mg by mouth daily.   Yes [provider]  potassium chloride (K-DUR,KLOR-CON) 10 MEQ tablet Take 10 mEq by mouth daily. Patient not taking: Reported on 02/15/2024    [provider]    Allergies as of 02/15/2024   (No Known Allergies)    Family History  Problem Relation Age of Onset   Breast cancer Maternal Grandmother 58    Social History   Socioeconomic History   Marital status: Married    Spouse name: Not on file   Number of children: Not on file   Years of education: Not on file   Highest education level: Not on  file  Occupational History   Not on file  Tobacco Use   Smoking status: Former    Current packs/day: 0.00    Average packs/day: 0.8 packs/day for 20.0 years (15.0 ttl pk-yrs)    Types: Cigarettes    Start date: 01/08/1998    Quit date: 01/08/2018    Years since quitting: 6.3   Smokeless tobacco: Never  Vaping Use   Vaping status: Former   Start date: 01/08/2018   Quit date: 04/10/2018  Substance and Sexual Activity   Alcohol use: Not Currently   Drug use: Not on file   Sexual activity: Not on file  Other Topics Concern   Not on file  Social History Narrative   Not on file   Social Drivers of Health   Financial Resource Strain: Not on file  Food Insecurity: Not on file  Transportation Needs: Not on file  Physical Activity: Not on file  Stress: Not on file  Social Connections: Not on file  Intimate Partner Violence: Not on file    Review of Systems: See HPI, otherwise negative ROS  Physical Exam: BP 139/83   Pulse 73   Temp (!) 96.9 F (36.1 C) (Temporal)   Resp 20   Ht 5' 6 (1.676 m)   Wt 73 kg   SpO2 100%   BMI 25.99 kg/m  General:   Alert,  pleasant and cooperative in NAD Head:  Normocephalic and atraumatic. Neck:  Supple;  no masses or thyromegaly. Lungs:  Clear throughout to auscultation.    Heart:  Regular rate and rhythm. Abdomen:  Soft, nontender and nondistended. Normal bowel sounds, without guarding, and without rebound.   Neurologic:  Alert and  oriented x4;  grossly normal neurologically.  Impression/Plan: Barbara Sanchez is here for an colonoscopy to be performed for a history of adenomatous polyps on 2019   Risks, benefits, limitations, and alternatives regarding  colonoscopy have been reviewed with the patient.  Questions have been answered.  All parties agreeable.   Barbara Copping, MD  05/27/2024, 7:53 AM

## 2024-05-27 NOTE — Op Note (Signed)
 [facility] Patient Name: Barbara Sanchez Procedure Date: 05/27/2024 9:00 AM MRN: [id number] Account #: [id number] Date of Birth: Jun 03, 1951 Admit Type: Outpatient Age: 73 Room: [facility] ENDO ROOM 4 Gender: Female Note Status: Finalized Instrument Name: Colon Scope [id number] Procedure:             Colonoscopy Indications:           High risk colon cancer surveillance: Personal history                         of colonic polyps Providers:             Rogelia Copping MD, MD Referring MD:          Rogelia Copping MD, MD (Referring MD), Rock EMERSON Pounds, MD                         (Referring MD) Medicines:             Propofol  per Anesthesia Complications:         No immediate complications. Procedure:             Pre-Anesthesia Assessment:                        - Prior to the procedure, a History and Physical was                         performed, and patient medications and allergies were                         reviewed. The patient's tolerance of previous                         anesthesia was also reviewed. The risks and benefits                         of the procedure and the sedation options and risks                         were discussed with the patient. All questions were                         answered, and informed consent was obtained. Prior                         Anticoagulants: The patient has taken no anticoagulant                         or antiplatelet agents. ASA Grade Assessment: II - A                         patient with mild systemic disease. After reviewing                         the risks and benefits, the patient was deemed in                         satisfactory condition to undergo the procedure.  After obtaining informed consent, the colonoscope was                         passed under direct vision. Throughout the procedure,                         the patient's blood pressure, pulse, and oxygen                          saturations were monitored continuously. The                         Colonoscope was introduced through the anus and                         advanced to the the cecum, identified by appendiceal                         orifice and ileocecal valve. The colonoscopy was                         performed without difficulty. The patient tolerated                         the procedure well. The quality of the bowel                         preparation was excellent. Findings:      The perianal and digital rectal examinations were normal.      A 3 mm polyp was found in the transverse colon. The polyp was sessile.       The polyp was removed with a cold snare. Resection and retrieval were       complete.      Non-bleeding internal hemorrhoids were found during retroflexion. The       hemorrhoids were Grade II (internal hemorrhoids that prolapse but reduce       spontaneously). Impression:            - One 3 mm polyp in the transverse colon, removed with                         a cold snare. Resected and retrieved.                        - Non-bleeding internal hemorrhoids. Recommendation:        - Discharge patient to home.                        - Resume previous diet.                        - Continue present medications.                        - Await pathology results.                        - Repeat colonoscopy is not recommended for  surveillance. Procedure Code(s):     --- Professional ---                        [id number], Colonoscopy, flexible; with removal of                         tumor(s), polyp(s), or other lesion(s) by snare                         technique Diagnosis Code(s):     --- Professional ---                        Z86.010, Personal history of colonic polyps                        D12.3, Benign neoplasm of transverse colon (hepatic                         flexure or splenic flexure) CPT copyright 2022 [employer]. All rights  reserved. The codes documented in this report are preliminary and upon coder review may  be revised to meet current compliance requirements. Rogelia Copping MD, MD 05/27/2024 9:21:10 AM This report has been signed electronically. Number of Addenda: 0 Note Initiated On: 05/27/2024 9:00 AM Scope Withdrawal Time: 0 hours 8 minutes 31 seconds  Total Procedure Duration: 0 hours 12 minutes 53 seconds  Estimated Blood Loss:  Estimated blood loss: none.      [facility]

## 2024-05-28 LAB — SURGICAL PATHOLOGY

## 2024-05-29 ENCOUNTER — Ambulatory Visit: Payer: Self-pay | Admitting: Gastroenterology
